# Patient Record
Sex: Female | Born: 2000 | Race: Black or African American | Hispanic: No | Marital: Single | Smoking: Never smoker
Health system: Southern US, Community
[De-identification: ages and names within clinical notes are randomized; demographics above are authoritative.]

## PROBLEM LIST (undated history)

## (undated) DIAGNOSIS — J45909 Unspecified asthma, uncomplicated: Secondary | ICD-10-CM

## (undated) DIAGNOSIS — D649 Anemia, unspecified: Secondary | ICD-10-CM

## (undated) HISTORY — DX: Unspecified asthma, uncomplicated: J45.909

## (undated) HISTORY — DX: Anemia, unspecified: D64.9

---
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
## 2019-06-14 IMAGING — US US OB TRANSVAGINAL
1 series · 14 of 28 positions shown · non-contrast
Comparison: None

CLINICAL DATA: Dating, assess viability; EGA 9 weeks 0 days by LMP

EXAM:
OBSTETRIC <14 WK US AND TRANSVAGINAL OB US
TECHNIQUE: Both transabdominal and transvaginal ultrasound examinations were
performed for complete evaluation of the gestation as well as the
maternal uterus, adnexal regions, and pelvic cul-de-sac.
Transvaginal technique was performed to assess early pregnancy.

[Series 1: us ob transvaginal · 0.20mm/px · 14 of 99 slices shown]
[im 4/99]
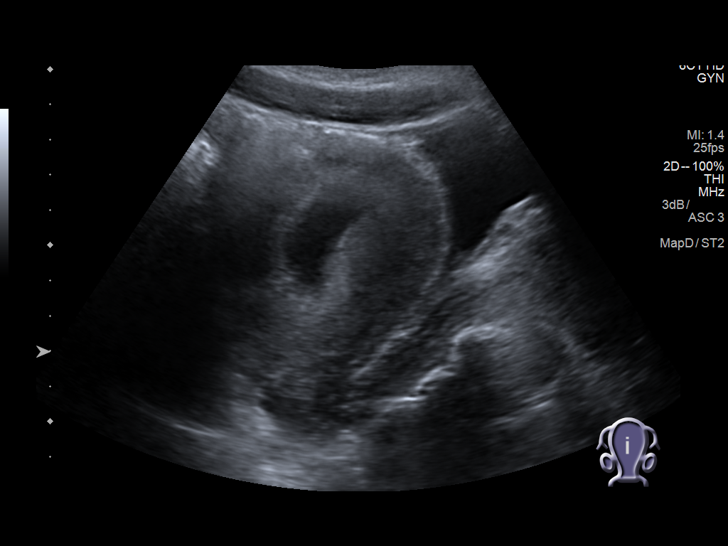
[im 11/99]
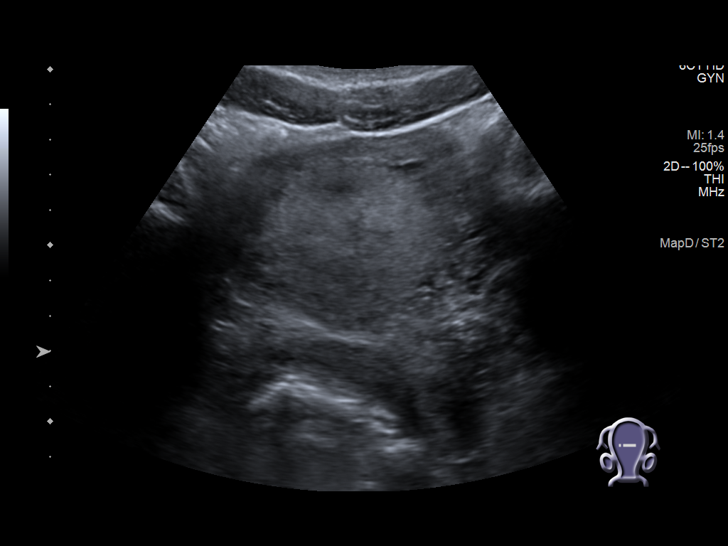
[im 19/99]
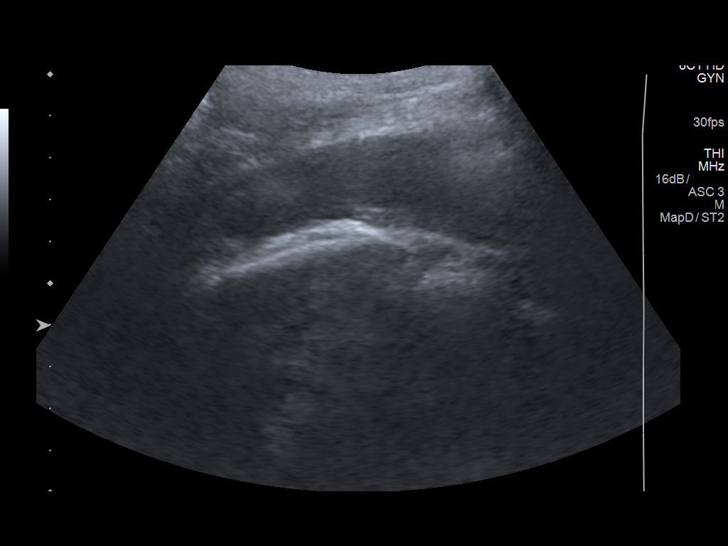
[im 26/99]
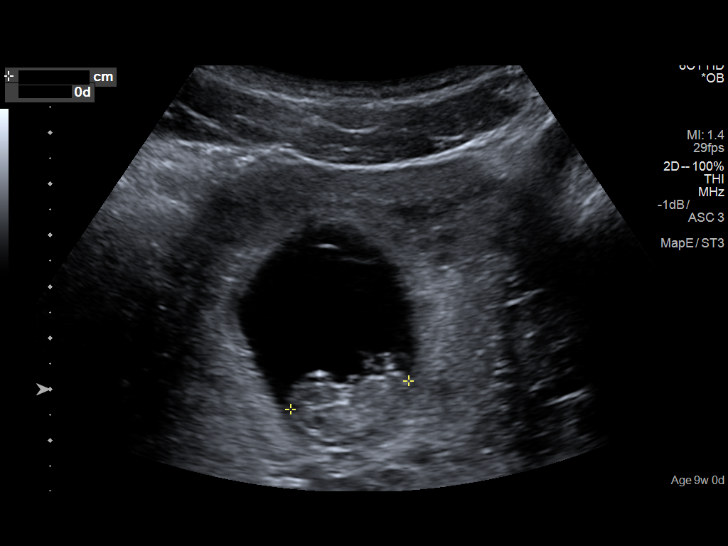
[im 33/99]
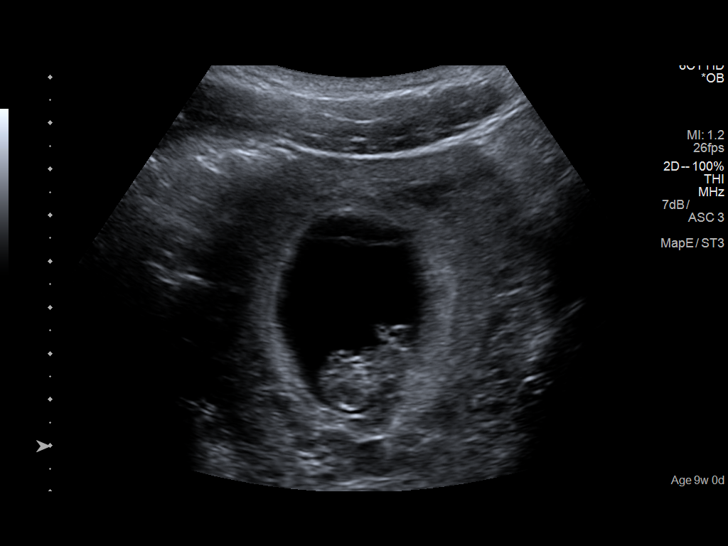
[im 40/99]
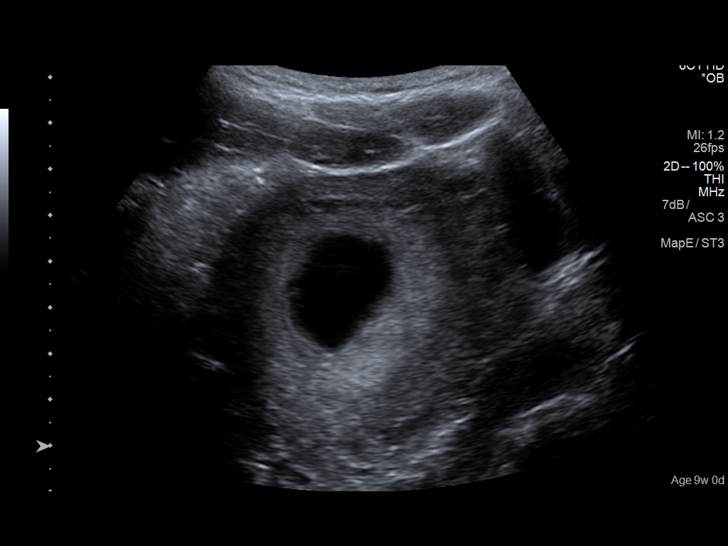
[im 48/99]
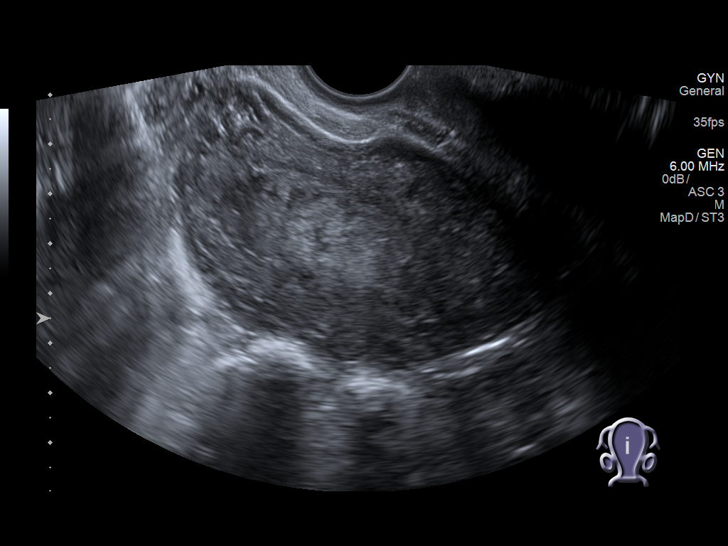
[im 55/99]
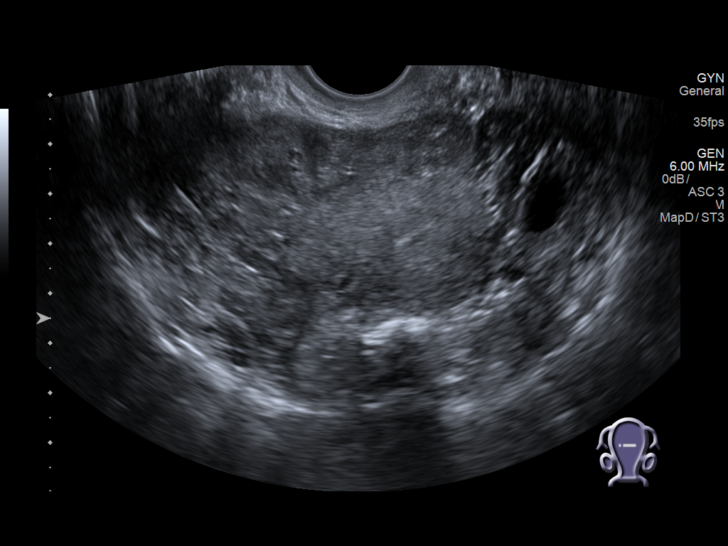
[im 62/99]
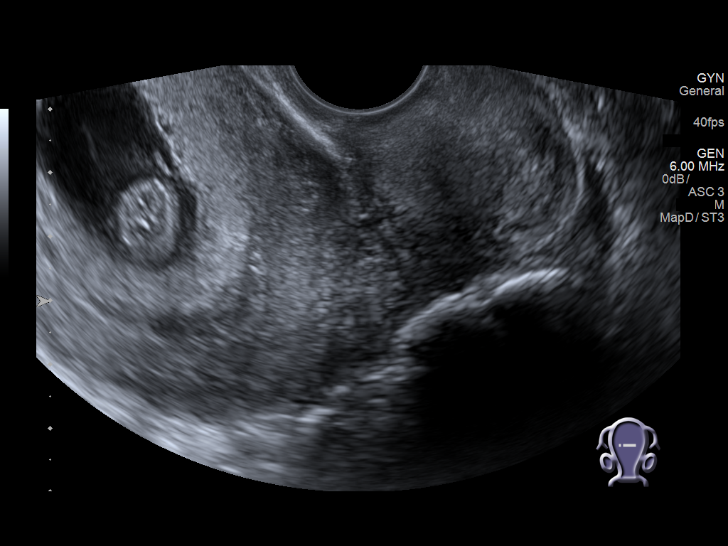
[im 69/99]
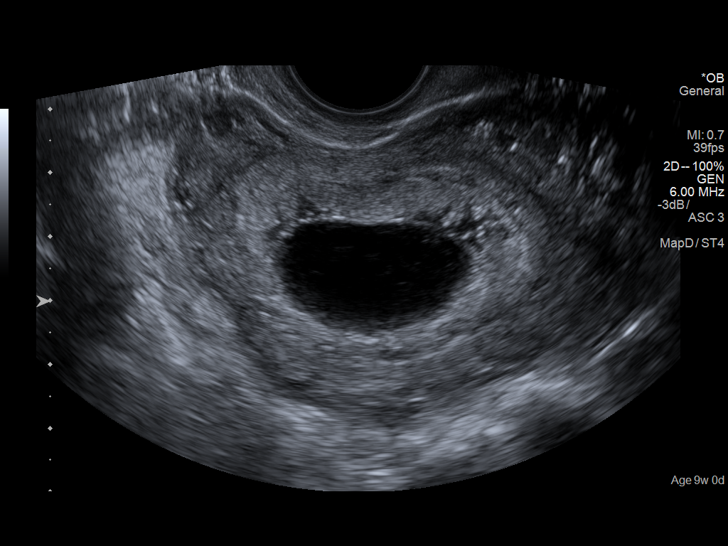
[im 77/99]
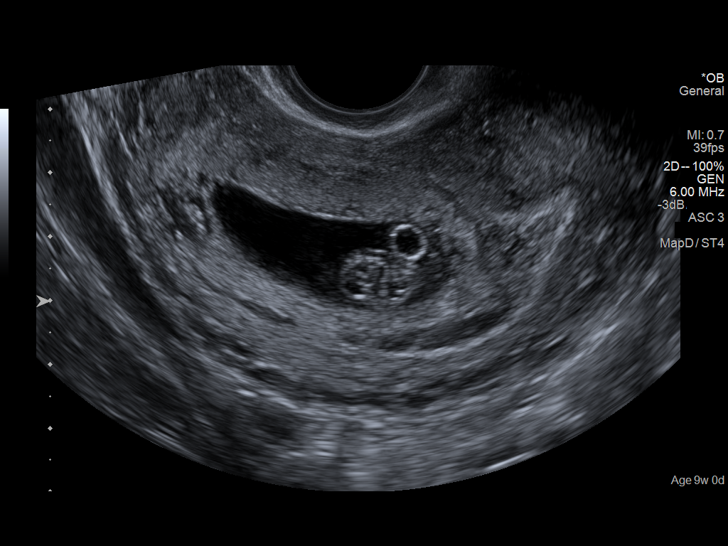
[im 84/99]
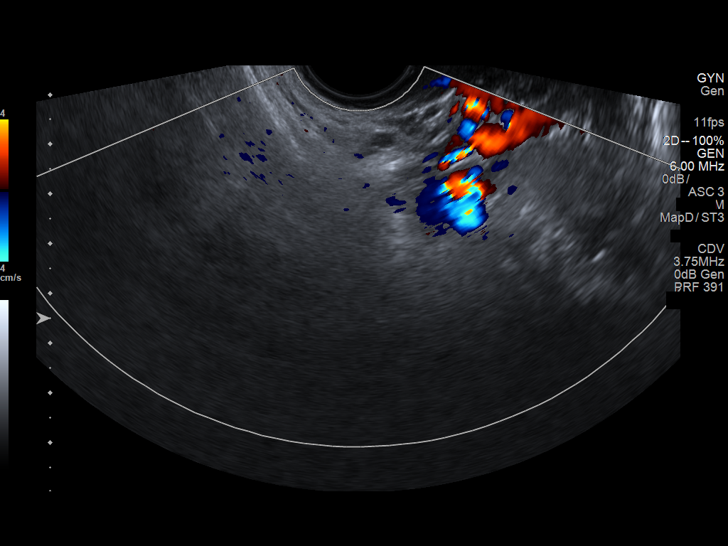
[im 91/99]
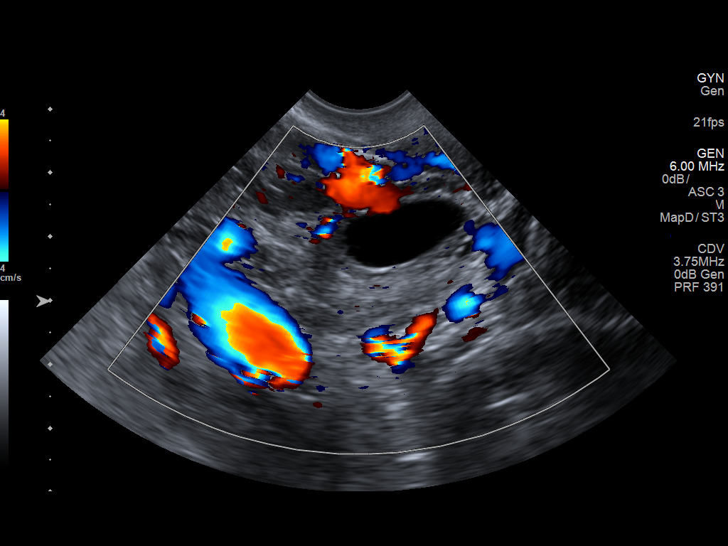
[im 99/99]
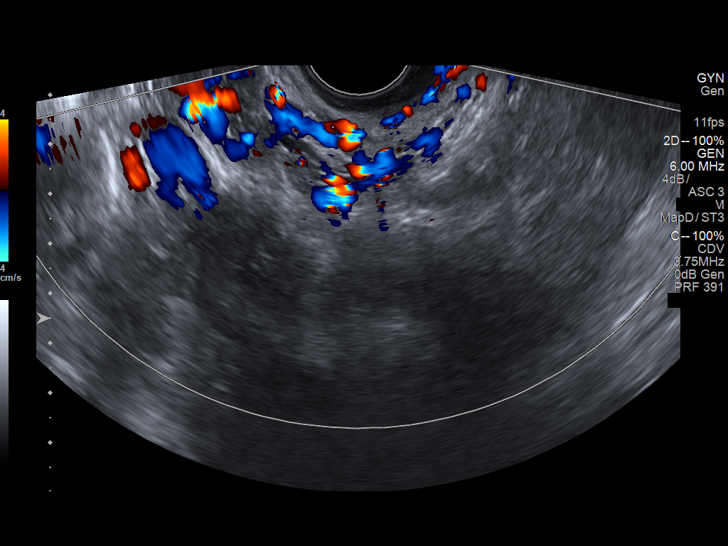

[14 of 28 positions shown; findings below may reference images not displayed]

FINDINGS: Intrauterine gestational sac: Present, single

Yolk sac:  Present

Embryo:  Present

Cardiac Activity: Present

Heart Rate: 168 bpm

CRL:  22.9 mm   9 w   0 d                  US EDC: 10/13/2018

Subchorionic hemorrhage:  None visualized.

Maternal uterus/adnexae:

RIGHT ovary not visualized, obscured by bowel in the RIGHT adnexa.

LEFT ovary measures 2.4 x 3.2 x 1.8 cm and contains a small cyst.

Trace free pelvic fluid.

No adnexal masses.
IMPRESSION: Single live intrauterine gestation at 9 weeks 0 days EGA by
crown-rump length.

## 2020-06-09 IMAGING — DX DG KNEE COMPLETE 4+V*R*
4 series · 4 of 4 positions shown · non-contrast
Comparison: None.

CLINICAL DATA: Patient reports a fall and it is having persistent
right knee pain. The patient is 15 weeks pregnant and was double
shielded.

EXAM:
RIGHT KNEE - COMPLETE 4+ VIEW

[knee ap]
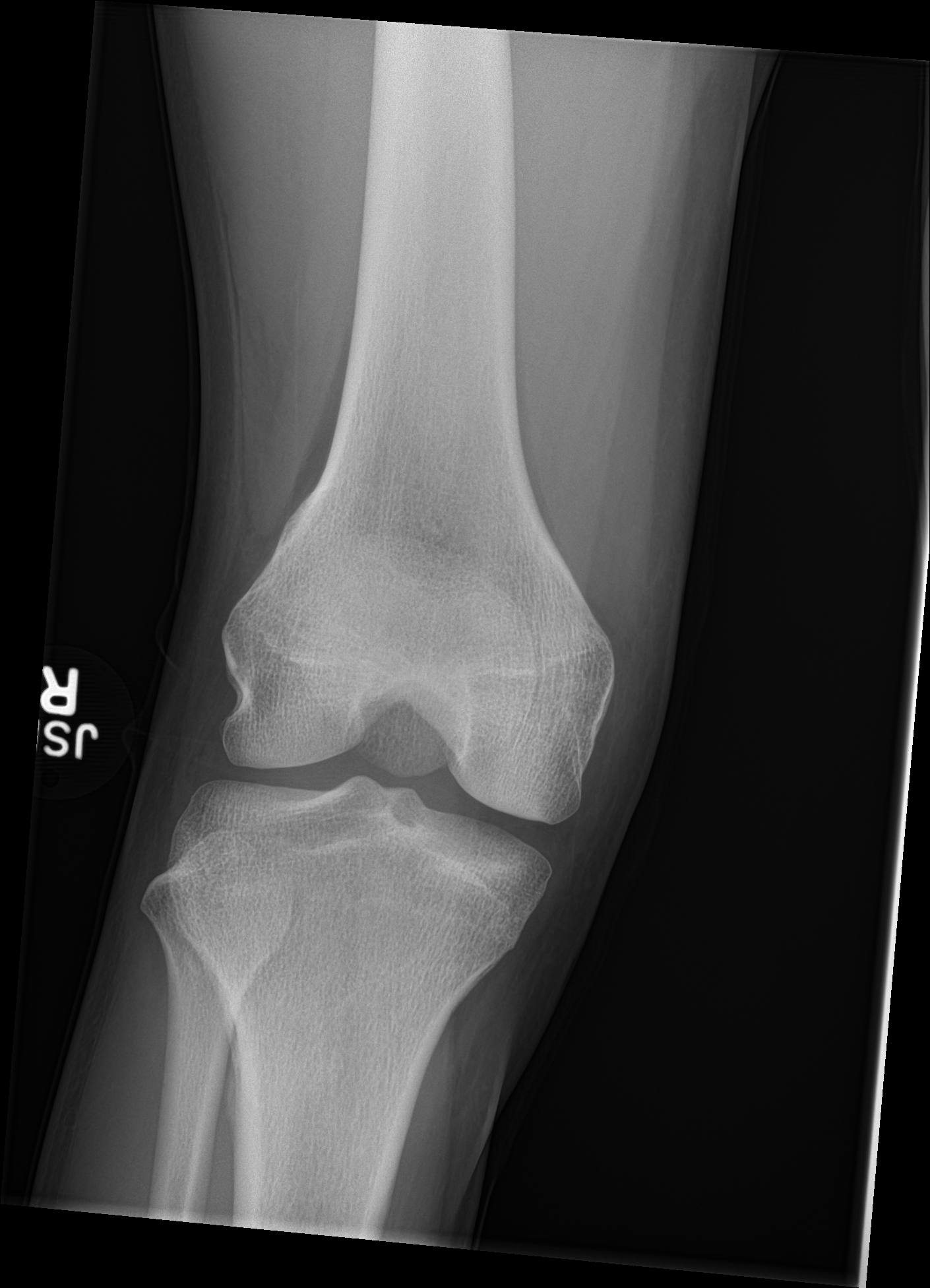

[knee obl (1 of 2)]
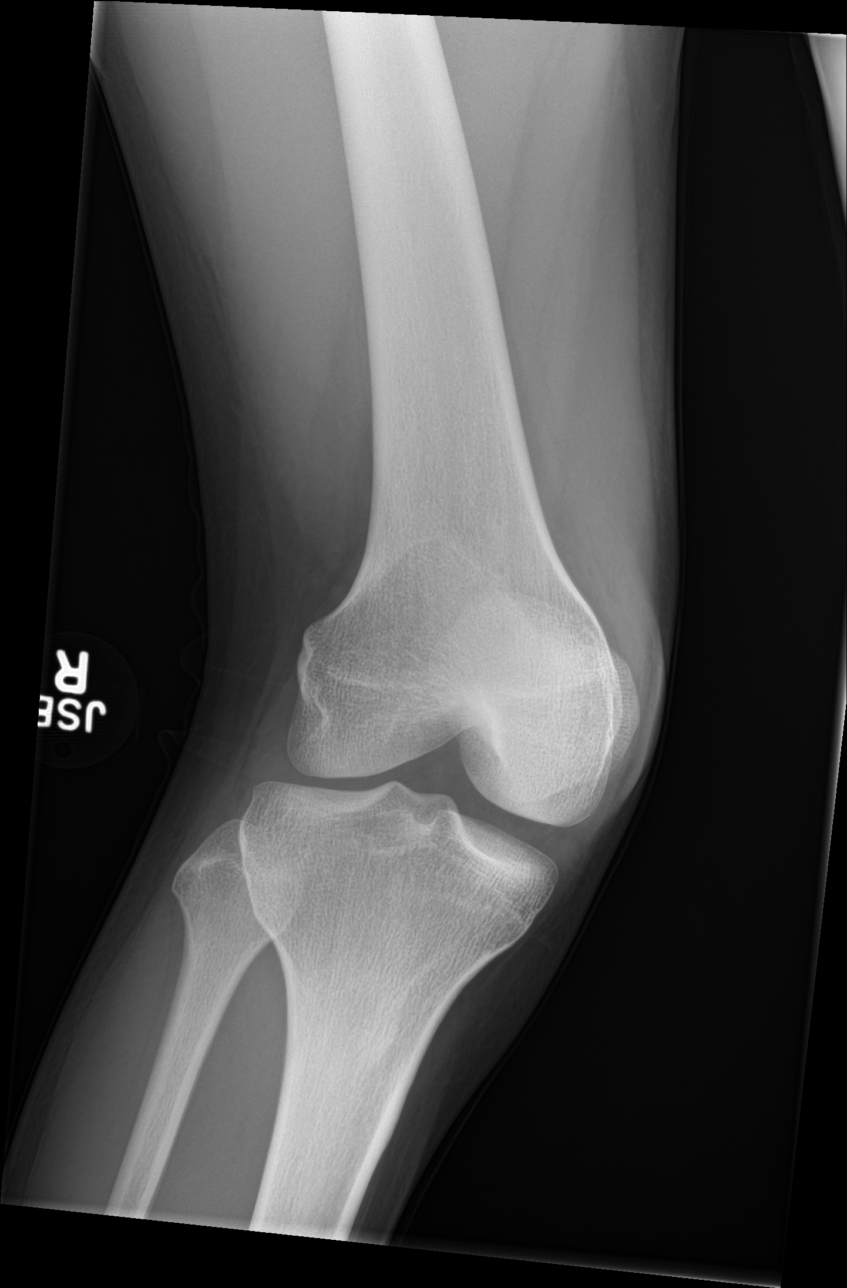

[knee obl (2 of 2)]
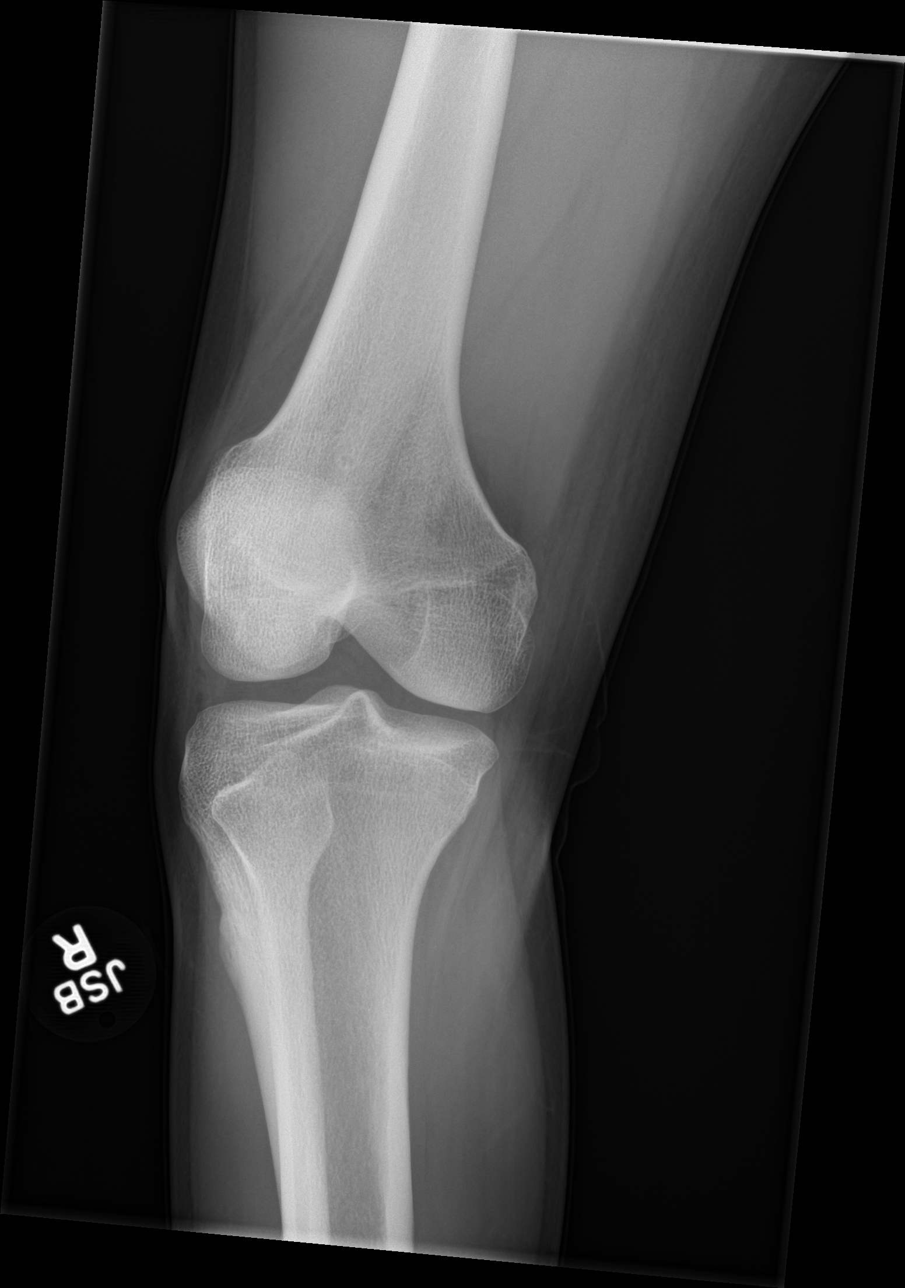

[knee lat]
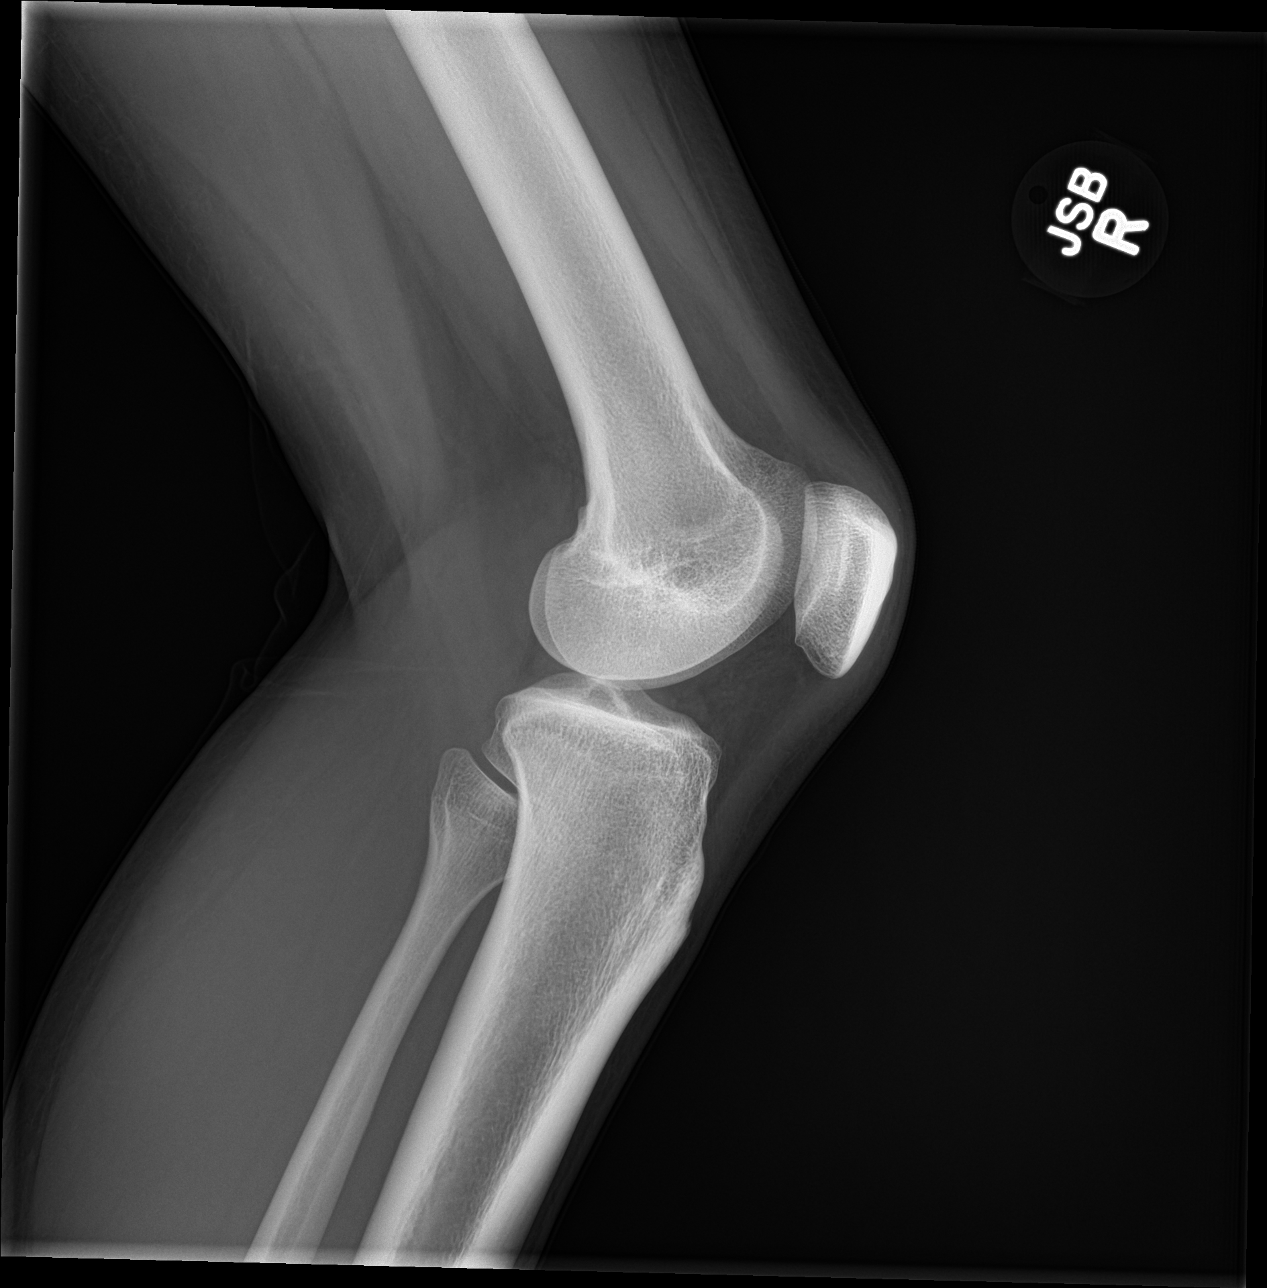

[4 of 4 positions shown; findings below may reference images not displayed]

FINDINGS: The bones are subjectively adequately mineralized. The joint spaces
are well maintained. There is no acute fracture or dislocation.
There is no joint effusion. No significant arthropathic changes are
observed.
IMPRESSION: There is no acute bony abnormality of the right knee.

## 2020-10-13 IMAGING — US US MFM UA CORD DOPPLER
1 series · 16 of 28 positions shown · non-contrast
Comparison: none

[Series 1: us mfm ua cord doppler · 38 acquisitions, 16 frames shown]
[im 1/38]
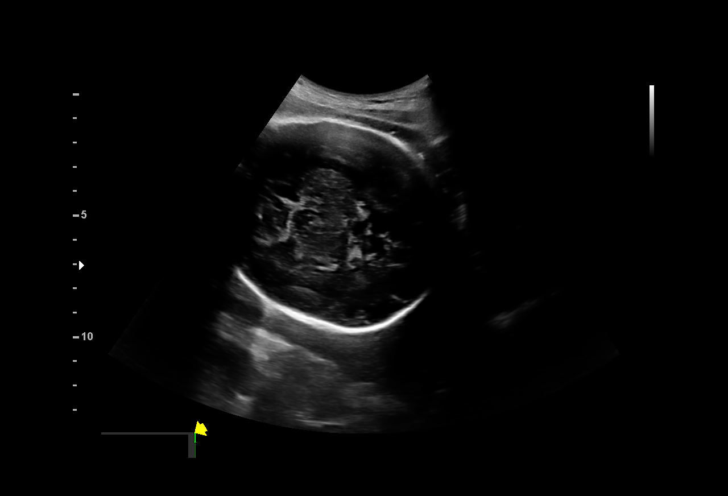
[im 3/38]
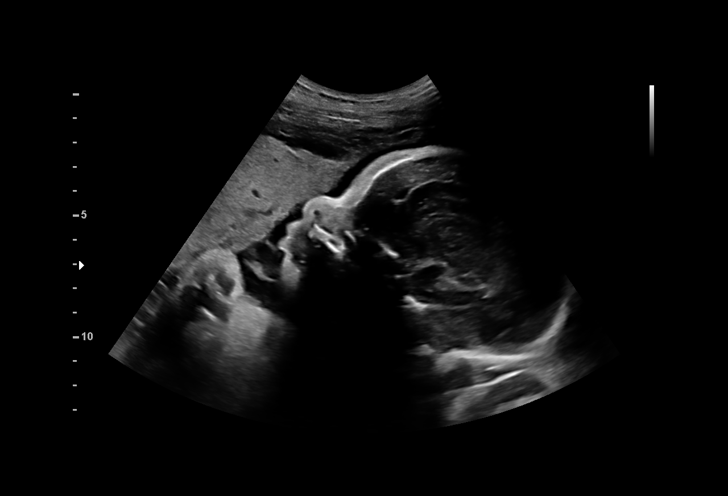
[im 6/38]
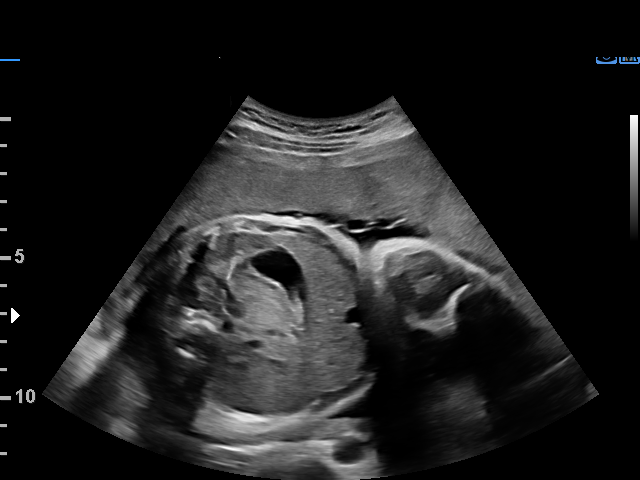
[im 9/38]
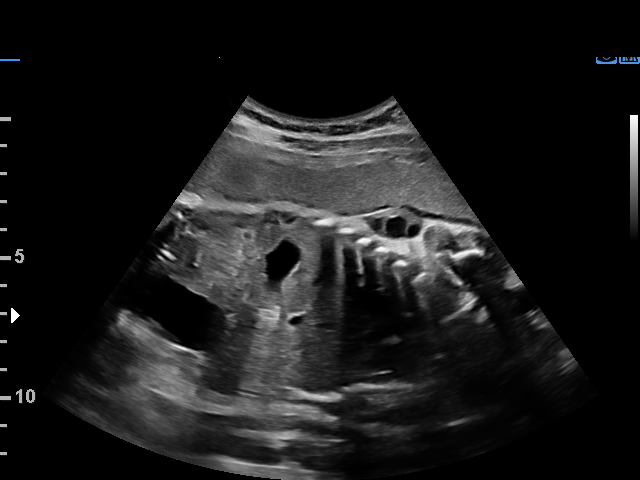
[im 10/38]
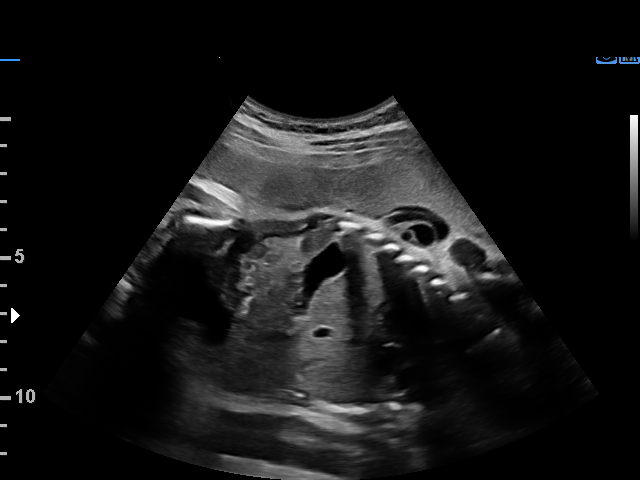
[im 13/38]
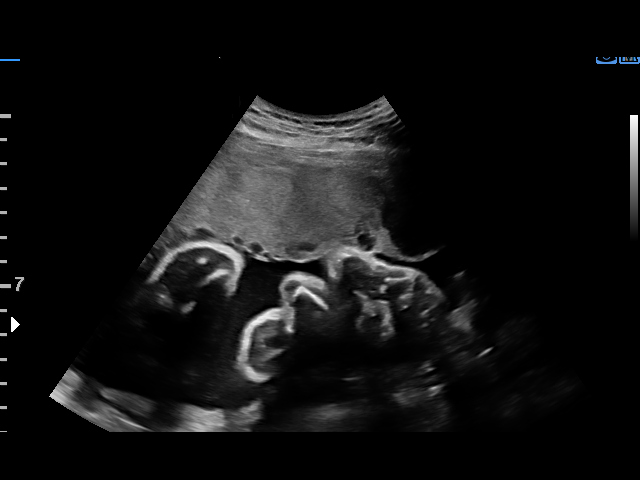
[im 16/38]
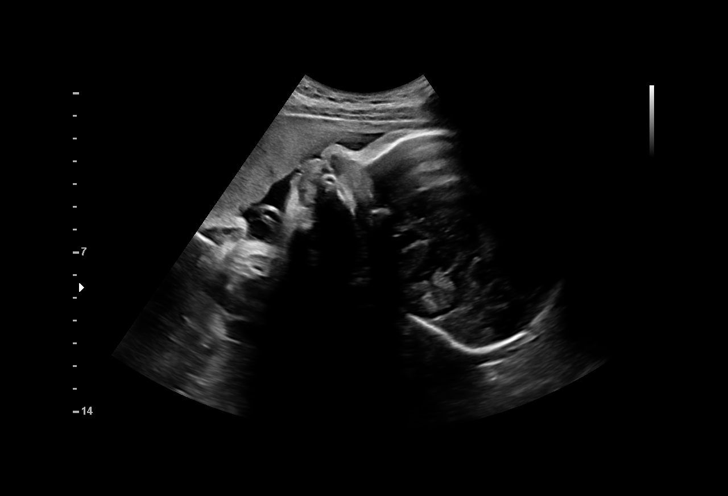
[im 18/38]
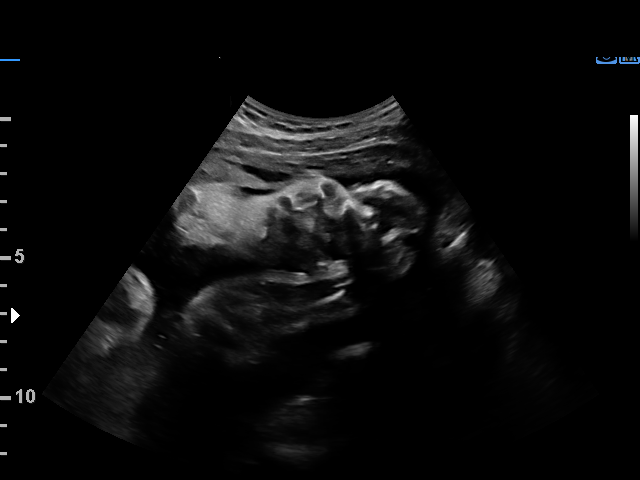
[im 20/38]
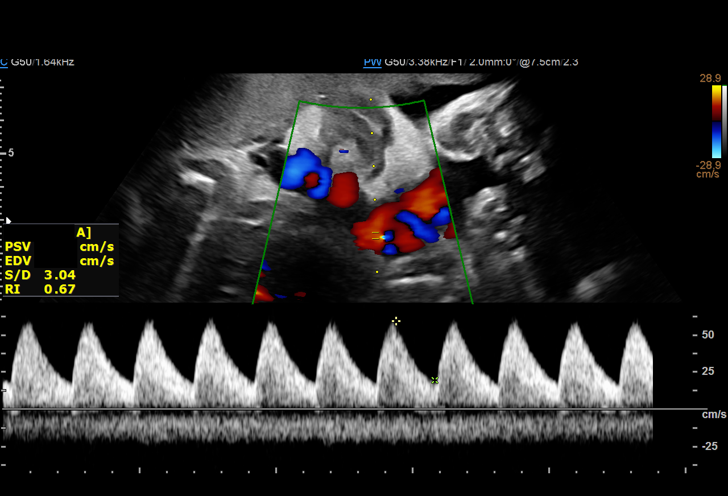
[im 22/38]
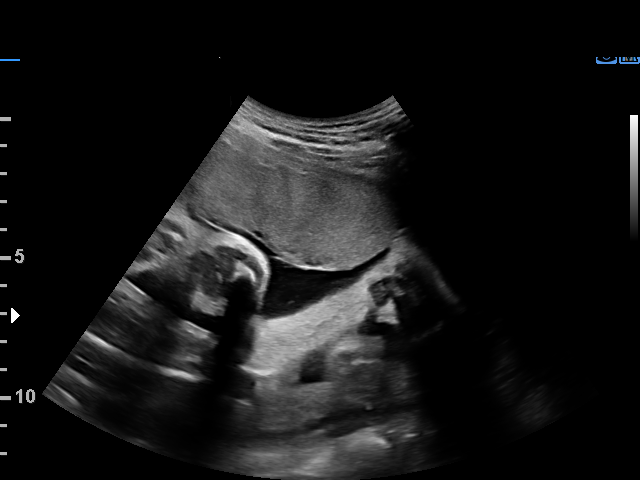
[im 25/38]
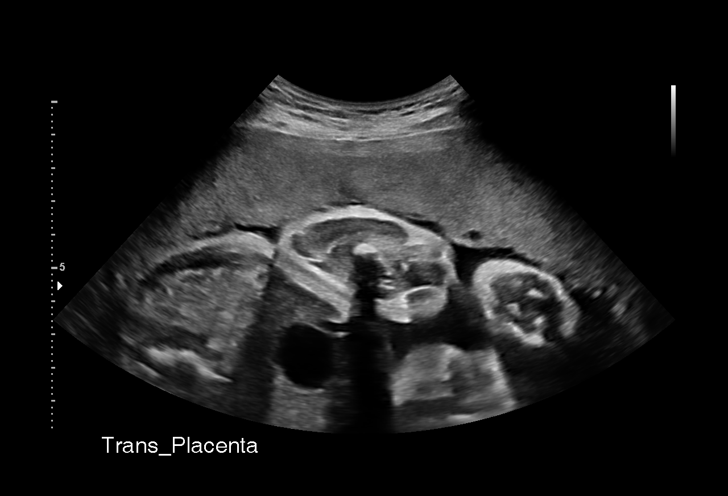
[im 28/38]
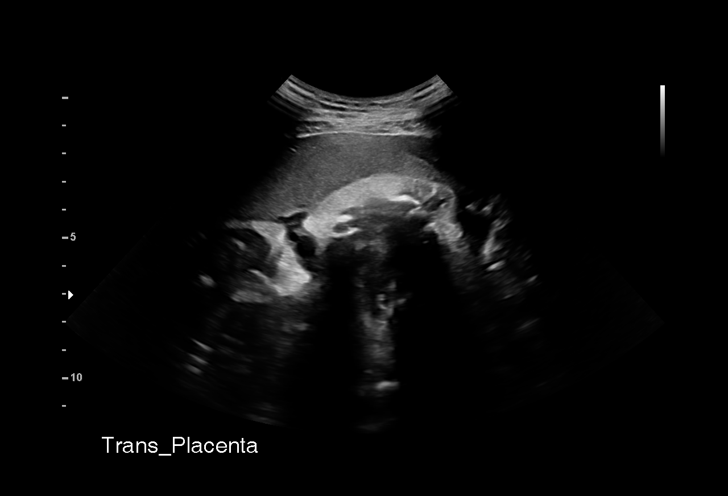
[im 29/38]
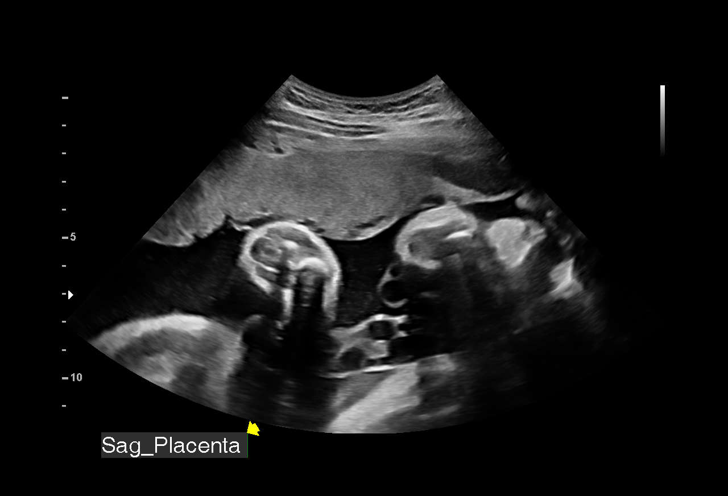
[im 32/38]
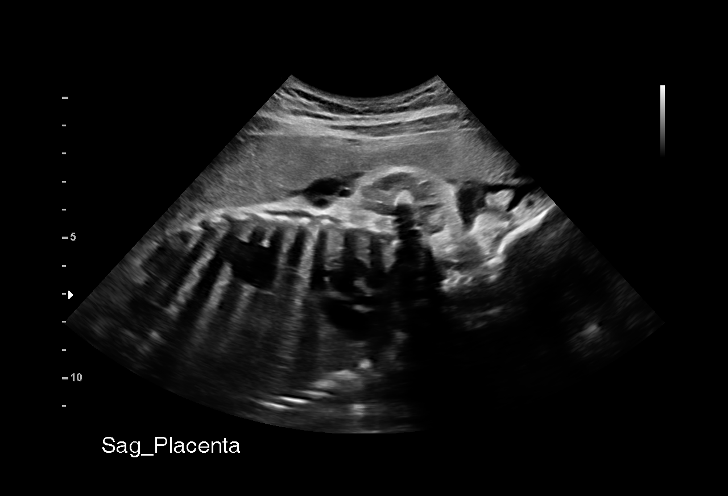
[im 35/38]
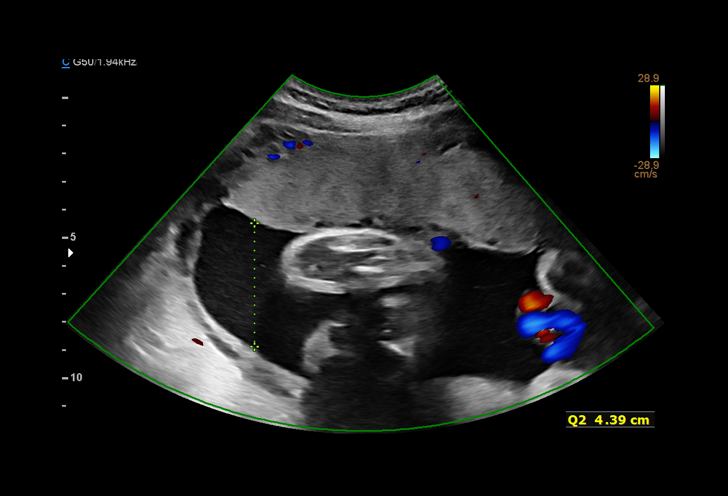
[im 38/38]
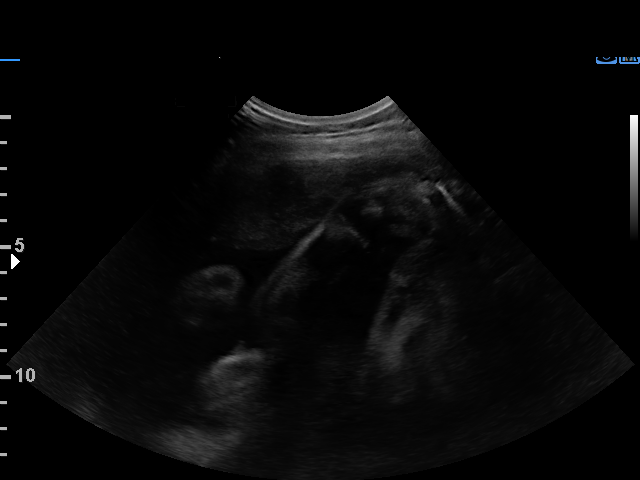

[16 of 28 positions shown; findings below may reference images not displayed]

[facility] CNM

  2  US MFM UA CORD DOPPLER               76820.02     PRINCE BERGLUND
 ----------------------------------------------------------------------

 ----------------------------------------------------------------------
Indications

  Maternal care for known or suspected poor
  fetal growth, third trimester, not applicable or
  unspecified
  33 weeks gestation of pregnancy
 ----------------------------------------------------------------------
Vital Signs

 BMI:
Fetal Evaluation

 Num Of Fetuses:          1
 Fetal Heart Rate(bpm):   129
 Cardiac Activity:        Observed
 Presentation:            Cephalic
 Placenta:                Anterior

 Amniotic Fluid
 AFI FV:      Within normal limits

 AFI Sum(cm)     %Tile       Largest Pocket(cm)
 11.87           32

 RUQ(cm)       RLQ(cm)       LUQ(cm)        LLQ(cm)

Biophysical Evaluation

 Amniotic F.V:   Within normal limits       F. Tone:         Observed
 F. Movement:    Observed                   Score:           [DATE]
 F. Breathing:   Observed
OB History

 Gravidity:    1
Gestational Age

 LMP:           33w 0d        Date:  01/06/18                 EDD:   10/13/18
 Best:          33w 0d     Det. By:  LMP  (01/06/18)          EDD:   10/13/18
Doppler - Fetal Vessels

 Umbilical Artery
  S/D     %tile                                            ADFV    RDFV
 3.15       75                                                No      No

Cervix Uterus Adnexa

 Cervix
 Not visualized (advanced GA >32wks)
Impression

 Biophysical profile [DATE]
 Normal UA Dopplers
Recommendations

 Continue testing per [HOSPITAL].
